# Patient Record
Sex: Female | Born: 1975 | Race: White | Hispanic: No | Marital: Married | Smoking: Never smoker
Health system: Southern US, Community
[De-identification: ages and names within clinical notes are randomized; demographics above are authoritative.]

## PROBLEM LIST (undated history)

## (undated) HISTORY — PX: ABDOMINAL HYSTERECTOMY: SHX81

---

## 2019-09-22 ENCOUNTER — Encounter (HOSPITAL_COMMUNITY): Payer: Self-pay | Admitting: Emergency Medicine

## 2019-09-22 ENCOUNTER — Other Ambulatory Visit: Payer: Self-pay

## 2019-09-22 ENCOUNTER — Emergency Department (HOSPITAL_COMMUNITY): Payer: 59

## 2019-09-22 ENCOUNTER — Emergency Department (HOSPITAL_COMMUNITY)
Admission: EM | Admit: 2019-09-22 | Discharge: 2019-09-22 | Disposition: A | Discharge status: Home / Self Care | Payer: 59 | Attending: Emergency Medicine | Admitting: Emergency Medicine

## 2019-09-22 DIAGNOSIS — R079 Chest pain, unspecified: Secondary | ICD-10-CM | POA: Diagnosis present

## 2019-09-22 DIAGNOSIS — R072 Precordial pain: Secondary | ICD-10-CM | POA: Diagnosis not present

## 2019-09-22 LAB — BASIC METABOLIC PANEL
Anion gap: 10 (ref 5–15)
BUN: 7 mg/dL (ref 6–20)
CO2: 26 mmol/L (ref 22–32)
Calcium: 9.5 mg/dL (ref 8.9–10.3)
Chloride: 100 mmol/L (ref 98–111)
Creatinine, Ser: 0.53 mg/dL (ref 0.44–1.00)
GFR calc Af Amer: >60 mL/min (ref >60)
GFR calc non Af Amer: >60 mL/min (ref >60)
Glucose, Bld: 104 mg/dL — ABNORMAL HIGH (ref 70–99)
Potassium: 4 mmol/L (ref 3.5–5.1)
Sodium: 136 mmol/L (ref 135–145)

## 2019-09-22 LAB — CBC
HCT: 43.9 % (ref 36.0–46.0)
Hemoglobin: 14.3 g/dL (ref 12.0–15.0)
MCH: 29.5 pg (ref 26.0–34.0)
MCHC: 32.6 g/dL (ref 30.0–36.0)
MCV: 90.7 fL (ref 80.0–100.0)
Platelets: 288 10*3/uL (ref 150–400)
RBC: 4.84 MIL/uL (ref 3.87–5.11)
RDW: 12.7 % (ref 11.5–15.5)
WBC: 6.2 10*3/uL (ref 4.0–10.5)
nRBC: 0 % (ref 0.0–0.2)

## 2019-09-22 LAB — TROPONIN I (HIGH SENSITIVITY): Troponin I (High Sensitivity): <2 ng/L (ref <18)

## 2019-09-22 MED ORDER — ALUM & MAG HYDROXIDE-SIMETH 200-200-20 MG/5ML PO SUSP
30.0000 mL | Freq: Once | ORAL | Status: AC
  Administered 2019-09-22: 30 mL via ORAL
  Filled 2019-09-22: qty 30

## 2019-09-22 MED ORDER — LORAZEPAM 2 MG/ML IJ SOLN
0.5000 mg | Freq: Once | INTRAMUSCULAR | Status: AC
  Administered 2019-09-22: 14:00:00 0.5 mg via INTRAVENOUS
  Filled 2019-09-22: qty 1

## 2019-09-22 MED ORDER — PANTOPRAZOLE SODIUM 40 MG PO TBEC: 40.0000 mg | DELAYED_RELEASE_TABLET | Freq: Every day | ORAL | 0 refills | Status: AC

## 2019-09-22 MED ORDER — FAMOTIDINE 20 MG PO TABS
20.0000 mg | ORAL_TABLET | Freq: Once | ORAL | Status: AC
  Administered 2019-09-22: 14:00:00 20 mg via ORAL
  Filled 2019-09-22: qty 1

## 2019-09-22 NOTE — ED Provider Notes (Signed)
[facility] EMERGENCY DEPARTMENT Provider Note   CSN: [id number] Arrival date & time: 09/22/19  1212     History Chief Complaint  Patient presents with  . Chest Pain    Katelyn Combs is a 44 y.o. female.  Patient c/o mid chest pain since approximately 3 am today. Symptoms acute onset at rest, dull, moderate, constant, persistent, non radiating, without specific exacerbating or alleviating factors. Pain in midline, mid to lower sternal area. Not pleuritic. No back or flank pain. No abd pain. Denies hx gerd. No associated nv, diaphoresis or sob. No other recent cp or discomfort even w exertion. No unusual doe or fatigue. No personal or family hx cad. No leg pain or swelling. No hx dvt or pe. No recent surgery or immobility. No chest wall injury or strain. No cough. No fever or chills.   The history is provided by the patient.  Chest Pain Associated symptoms: no abdominal pain, no back pain, no cough, no fever, no headache, no shortness of breath and no vomiting        History reviewed. No pertinent past medical history.  There are no problems to display for this patient.   Past Surgical History:  Procedure Laterality Date  . ABDOMINAL HYSTERECTOMY     partial     OB History   No obstetric history on file.     History reviewed. No pertinent family history.  Social History   Tobacco Use  . Smoking status: Never Smoker  . Smokeless tobacco: Never Used  Substance Use Topics  . Alcohol use: Yes    Comment: occ  . Drug use: Not on file    Home Medications Prior to Admission medications   Not on File    Allergies    Patient has no allergy information on record.  Review of Systems   Review of Systems  Constitutional: Negative for chills and fever.  HENT: Negative for sore throat.   Eyes: Negative for redness.  Respiratory: Negative for cough and shortness of breath.   Cardiovascular: Positive for chest pain.  Gastrointestinal: Negative for abdominal pain  and vomiting.  Genitourinary: Negative for flank pain.  Musculoskeletal: Negative for back pain and neck pain.  Skin: Negative for rash.  Neurological: Negative for headaches.  Hematological: Does not bruise/bleed easily.  Psychiatric/Behavioral: Negative for confusion.    Physical Exam Updated Vital Signs BP 125/82 (BP Location: Right Arm)   Pulse 79   Temp 97.9 F (36.6 C) (Oral)   Resp 20   Ht 1.702 m (5\' 7" )   Wt 65.8 kg   SpO2 100%   BMI 22.71 kg/m   Physical Exam Vitals and nursing note reviewed.  Constitutional:      Appearance: Normal appearance. She is well-developed.  HENT:     Head: Atraumatic.     Nose: Nose normal.     Mouth/Throat:     Mouth: Mucous membranes are moist.  Eyes:     General: No scleral icterus.    Conjunctiva/sclera: Conjunctivae normal.  Neck:     Trachea: No tracheal deviation.  Cardiovascular:     Rate and Rhythm: Normal rate and regular rhythm.     Pulses: Normal pulses.     Heart sounds: Normal heart sounds. No murmur. No friction rub. No gallop.   Pulmonary:     Effort: Pulmonary effort is normal. No respiratory distress.     Breath sounds: Normal breath sounds.  Chest:     Chest wall: Tenderness present.  Abdominal:  General: Bowel sounds are normal. There is no distension.     Palpations: Abdomen is soft. There is no mass.     Tenderness: There is no abdominal tenderness. There is no guarding or rebound.     Hernia: No hernia is present.  Genitourinary:    Comments: No cva tenderness.  Musculoskeletal:        General: No swelling or tenderness.     Cervical back: Normal range of motion and neck supple. No rigidity. No muscular tenderness.     Right lower leg: No edema.     Left lower leg: No edema.  Skin:    General: Skin is warm and dry.     Findings: No rash.  Neurological:     Mental Status: She is alert.     Comments: Alert, speech normal.   Psychiatric:     Comments: Anxious appearing.      ED Results /  Procedures / Treatments   Labs (all labs ordered are listed, but only abnormal results are displayed) Results for orders placed or performed during the hospital encounter of 09/22/19  Basic metabolic panel  Result Value Ref Range   Sodium 136 135 - 145 mmol/L   Potassium 4.0 3.5 - 5.1 mmol/L   Chloride 100 98 - 111 mmol/L   CO2 26 22 - 32 mmol/L   Glucose, Bld 104 (H) 70 - 99 mg/dL   BUN 7 6 - 20 mg/dL   Creatinine, Ser 4.19 0.44 - 1.00 mg/dL   Calcium 9.5 8.9 - 62.2 mg/dL   GFR calc non Af Amer >60 >60 mL/min   GFR calc Af Amer >60 >60 mL/min   Anion gap 10 5 - 15  CBC  Result Value Ref Range   WBC 6.2 4.0 - 10.5 K/uL   RBC 4.84 3.87 - 5.11 MIL/uL   Hemoglobin 14.3 12.0 - 15.0 g/dL   HCT 29.7 98.9 - 21.1 %   MCV 90.7 80.0 - 100.0 fL   MCH 29.5 26.0 - 34.0 pg   MCHC 32.6 30.0 - 36.0 g/dL   RDW 94.1 74.0 - 81.4 %   Platelets 288 150 - 400 K/uL   nRBC 0.0 0.0 - 0.2 %  Troponin I (High Sensitivity)  Result Value Ref Range   Troponin I (High Sensitivity) <2 <18 ng/L   DG Chest 2 View  Result Date: 09/22/2019 CLINICAL DATA:  Chest pain EXAM: CHEST - 2 VIEW COMPARISON:  None. FINDINGS: The heart size and mediastinal contours are within normal limits. Both lungs are clear. No pleural effusion or pneumothorax. The visualized skeletal structures are unremarkable. IMPRESSION: No acute process in the chest. Electronically Signed   By: Guadlupe Spanish M.D.   On: 09/22/2019 14:09    EKG EKG Interpretation  Date/Time:  Monday Sep 22 2019 13:34:59 EDT Ventricular Rate:  62 PR Interval:    QRS Duration: 87 QT Interval:  399 QTC Calculation: 406 R Axis:   107 Text Interpretation: Sinus rhythm Right axis deviation No previous tracing Confirmed by Cathren Laine ([id number]) on 09/22/2019 1:35:55 PM   Radiology DG Chest 2 View  Result Date: 09/22/2019 CLINICAL DATA:  Chest pain EXAM: CHEST - 2 VIEW COMPARISON:  None. FINDINGS: The heart size and mediastinal contours are within normal  limits. Both lungs are clear. No pleural effusion or pneumothorax. The visualized skeletal structures are unremarkable. IMPRESSION: No acute process in the chest. Electronically Signed   By: Guadlupe Spanish M.D.   On: 09/22/2019 14:09  Procedures Procedures (including critical care time)  Medications Ordered in ED Medications - No data to display  ED Course  I have reviewed the triage vital signs and the nursing notes.  Pertinent labs & imaging results that were available during my care of the patient were reviewed by me and considered in my medical decision making (see chart for details).    MDM Rules/Calculators/A&P                     Iv ns. Continuous pulse ox and monitor. Stat labs. Pcxr. Ecg.   Reviewed nursing notes and prior charts for additional history.   pepcid and maalox for symptom relief.   Ativan .5 mg iv for anxiety.   Initial labs reviewed/interpreted by me -  Trop normal - after constant/persistent symptoms for approx 10+ hrs, trop is normal, felt not c/w acs.   Recheck symptoms improved/resolved. Vitals normal.   CXR reviewed/interpreted by me - no pna.   Patient currently appears stable for d/c.   Will try gi meds at home. rec pcp f/u.   Return precautions provided.      Final Clinical Impression(s) / ED Diagnoses Final diagnoses:  None    Rx / DC Orders ED Discharge Orders    None       Cathren Laine, MD 09/22/19 1442

## 2019-09-22 NOTE — Discharge Instructions (Addendum)
It was our pleasure to provide your ER care today - we hope that you feel better.  Take protonix (acid blocker medication). You may also try pepcid, maalox or acetaminophen as need for symptom relief.   Follow up with primary care doctor in 1-2 weeks.   For chest discomfort, you may also follow up with cardiologist in the next couple weeks.  Return to ER if worse, new symptoms, recurrent or persistent chest pain, increased trouble breathing, or other concern.

## 2019-09-22 NOTE — ED Triage Notes (Signed)
Per EMS, pt reports chest pain, headache, nausea since 3 am. Pt reports "knot" in chest with pain onset. 4mg  zofran administered en route. Pt rating pain 7/10. Pt took 3 baby aspirin prior to ems arrival.

## 2019-09-22 NOTE — Progress Notes (Signed)
CSW has reviewed patients chart and notes that patient is without primary care and/or insurance. CSW will follow up with patient regarding this matter and provide assistance as needed.   Recently switched insurance along with moving to a different location. Patient states that although her current pcp is 30 minutes away. Patient states that she wishes for her current to be added to her chart until she finds another.    Patient states her primary care doctor is Clarita Crane with [facility]. CSW will update patients chart to reflect pcp.   Amberli Ruegg Sherryle Lis LCSWA Transitions of Care  Clinical Social Worker  Ph: [phone]

## 2020-05-25 ENCOUNTER — Other Ambulatory Visit: Payer: Self-pay

## 2020-05-25 DIAGNOSIS — Z20822 Contact with and (suspected) exposure to covid-19: Secondary | ICD-10-CM

## 2020-05-25 DIAGNOSIS — J069 Acute upper respiratory infection, unspecified: Secondary | ICD-10-CM | POA: Diagnosis not present

## 2020-05-26 LAB — SARS-COV-2, NAA 2 DAY TAT

## 2020-05-26 LAB — NOVEL CORONAVIRUS, NAA: SARS-CoV-2, NAA: DETECTED — AB

## 2020-05-31 DIAGNOSIS — U071 COVID-19: Secondary | ICD-10-CM | POA: Diagnosis not present

## 2020-05-31 DIAGNOSIS — F411 Generalized anxiety disorder: Secondary | ICD-10-CM | POA: Diagnosis not present

## 2020-05-31 DIAGNOSIS — Z90710 Acquired absence of both cervix and uterus: Secondary | ICD-10-CM | POA: Diagnosis not present

## 2020-12-28 ENCOUNTER — Other Ambulatory Visit: Payer: Self-pay

## 2020-12-28 ENCOUNTER — Ambulatory Visit
Admission: EM | Admit: 2020-12-28 | Discharge: 2020-12-28 | Discharge status: Absent without leave | Payer: BC Managed Care – PPO

## 2021-06-02 DIAGNOSIS — Z90710 Acquired absence of both cervix and uterus: Secondary | ICD-10-CM | POA: Diagnosis not present

## 2021-06-02 DIAGNOSIS — F411 Generalized anxiety disorder: Secondary | ICD-10-CM | POA: Diagnosis not present

## 2021-06-30 DIAGNOSIS — F411 Generalized anxiety disorder: Secondary | ICD-10-CM | POA: Diagnosis not present

## 2021-06-30 DIAGNOSIS — Z1231 Encounter for screening mammogram for malignant neoplasm of breast: Secondary | ICD-10-CM | POA: Diagnosis not present

## 2021-09-12 IMAGING — DX DG CHEST 2V
2 series · 2 of 2 positions shown · non-contrast
Comparison: None.

CLINICAL DATA: Chest pain

EXAM:
CHEST - 2 VIEW

[chest pa]
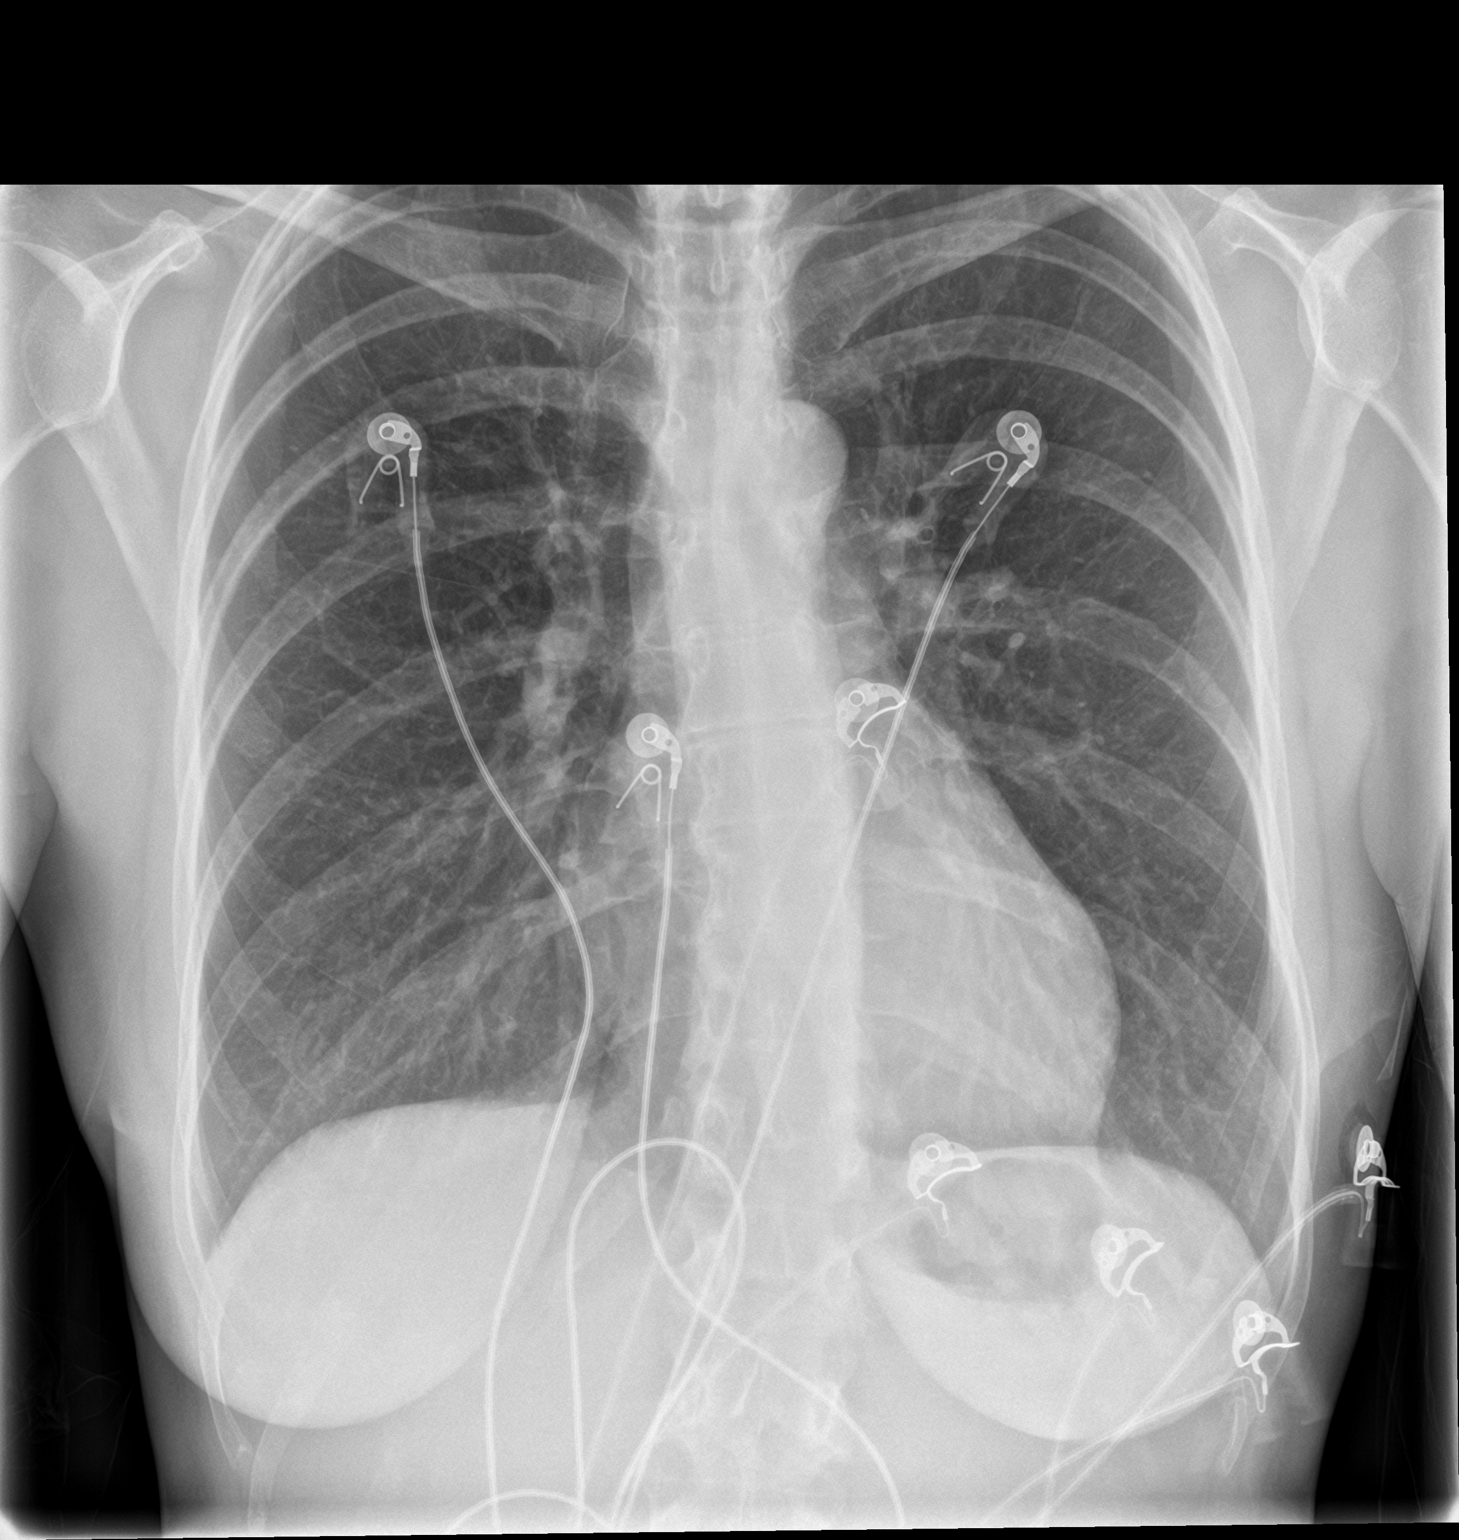

[chest lat]
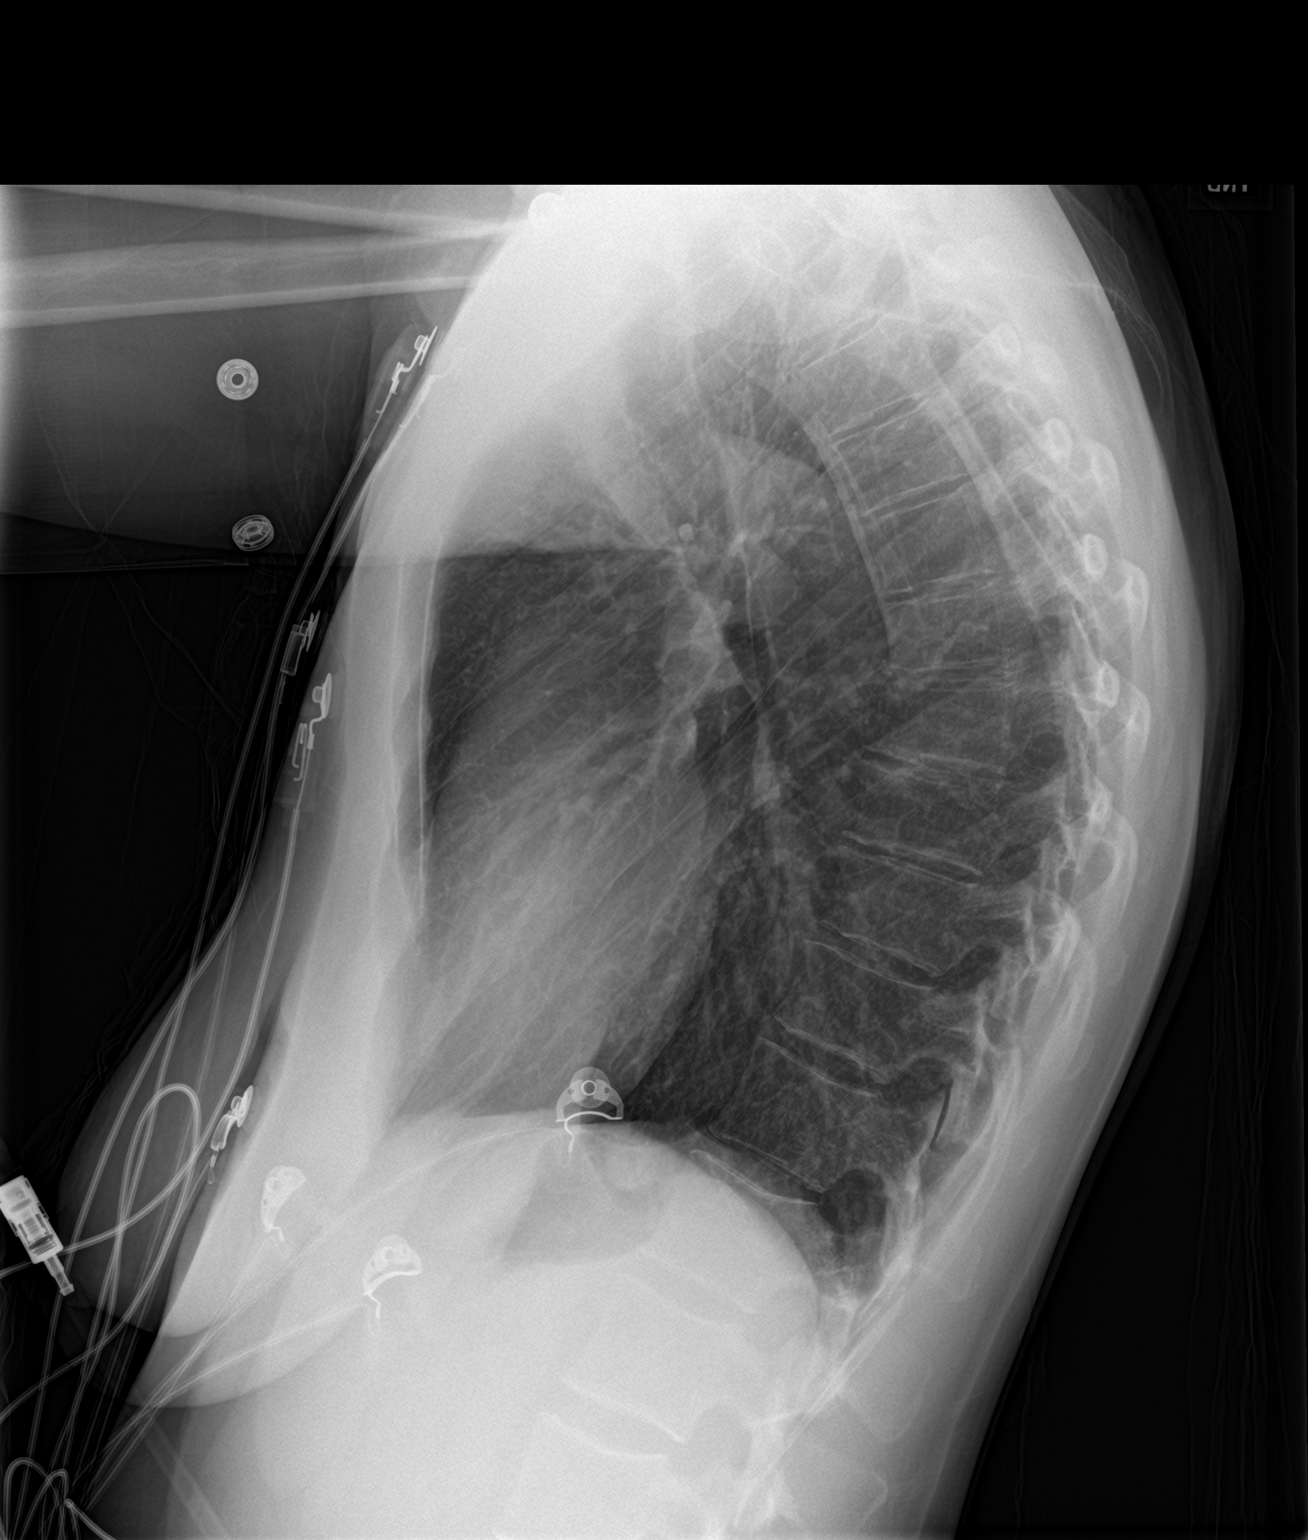

[2 of 2 positions shown; findings below may reference images not displayed]

FINDINGS: The heart size and mediastinal contours are within normal limits.
Both lungs are clear. No pleural effusion or pneumothorax. The
visualized skeletal structures are unremarkable.
IMPRESSION: No acute process in the chest.

## 2021-09-28 ENCOUNTER — Ambulatory Visit
Admission: RE | Admit: 2021-09-28 | Discharge: 2021-09-28 | Disposition: A | Discharge status: Home / Self Care | Payer: BC Managed Care – PPO | Source: Ambulatory Visit

## 2021-09-28 VITALS — BP 157/91 | HR 84 | Temp 97.8°F | Resp 18

## 2021-09-28 DIAGNOSIS — J069 Acute upper respiratory infection, unspecified: Secondary | ICD-10-CM

## 2021-09-28 MED ORDER — CETIRIZINE HCL 10 MG PO TABS: 10.0000 mg | ORAL_TABLET | Freq: Every day | ORAL | 0 refills | Status: AC

## 2021-09-28 MED ORDER — AMOXICILLIN-POT CLAVULANATE 875-125 MG PO TABS: 1.0000 | ORAL_TABLET | Freq: Two times a day (BID) | ORAL | 0 refills | Status: AC

## 2021-09-28 MED ORDER — PSEUDOEPH-BROMPHEN-DM 30-2-10 MG/5ML PO SYRP: 5.0000 mL | ORAL_SOLUTION | Freq: Four times a day (QID) | ORAL | 0 refills | Status: AC | PRN

## 2021-09-28 MED ORDER — FLUTICASONE PROPIONATE 50 MCG/ACT NA SUSP: 2.0000 | Freq: Every day | NASAL | 0 refills | Status: AC

## 2021-09-28 NOTE — ED Provider Notes (Signed)
?[facility] ? ? ? ?CSN: [id number] ?Arrival date & time: 09/28/21  1044 ? ? ?  ? ?History   ?Chief Complaint ?Chief Complaint  ?Patient presents with  ? Cough  ?  Entered by patient  ? Appointment  ?  1100  ? ? ?HPI ?Katelyn Combs is a 46 y.o. female.  ? ?The patient is a 46 year old female who presents with upper respiratory symptoms. The patient reports nasal congestion x 5 days; chest congestion x 2 days. Chest congestion and heaviness is worse when coughing.  The cough is persistent throughout the day, it is nonproductive.  She also complains of "heaviness" in her chest.  She also complains of intermittent chills.  She denies fever, headache, sore throat, abdominal pain, nausea, vomiting, or diarrhea.  She denies any past history of seasonal allergies or other sick contacts. NyQuil gives some relief.  ? ?The history is provided by the patient.  ? ?History reviewed. No pertinent past medical history. ? ?There are no problems to display for this patient. ? ? ?Past Surgical History:  ?Procedure Laterality Date  ? ABDOMINAL HYSTERECTOMY    ? partial  ? ? ?OB History   ?No obstetric history on file. ?  ? ? ? ?Home Medications   ? ?Prior to Admission medications   ?Medication Sig Start Date End Date Taking? Authorizing Provider  ?amoxicillin-clavulanate (AUGMENTIN) 875-125 MG tablet Take 1 tablet by mouth every 12 (twelve) hours. 10/01/21  Yes Darel Ricketts-Warren, Alda Lea, NP  ?brompheniramine-pseudoephedrine-DM 30-2-10 MG/5ML syrup Take 5 mLs by mouth 4 (four) times daily as needed. 09/28/21  Yes Deonna Krummel-Warren, Alda Lea, NP  ?cetirizine (ZYRTEC) 10 MG tablet Take 1 tablet (10 mg total) by mouth daily. 09/28/21  Yes Chayil Gantt-Warren, Alda Lea, NP  ?fluticasone (FLONASE) 50 MCG/ACT nasal spray Place 2 sprays into both nostrils daily. 09/28/21  Yes Zia Kanner-Warren, Alda Lea, NP  ?PARoxetine (PAXIL-CR) 12.5 MG 24 hr tablet Take by mouth. 06/30/21  Yes [provider]  ?Pseudoeph-Doxylamine-DM-APAP (NYQUIL PO)  Take by mouth.   Yes [provider]  ?pantoprazole (PROTONIX) 40 MG tablet Take 1 tablet (40 mg total) by mouth daily. 09/22/19   Lajean Saver, MD  ? ? ?Family History ?History reviewed. No pertinent family history. ? ?Social History ?Social History  ? ?Tobacco Use  ? Smoking status: Never  ? Smokeless tobacco: Never  ?Substance Use Topics  ? Alcohol use: Yes  ?  Comment: occ  ? Drug use: Never  ? ? ? ?Allergies   ?Patient has no known allergies. ? ? ?Review of Systems ?Review of Systems  ?Constitutional:  Positive for chills.  ?HENT:  Positive for congestion and postnasal drip.   ?Respiratory:  Positive for cough. Negative for shortness of breath and wheezing.   ?Cardiovascular: Negative.   ?Gastrointestinal: Negative.   ?Skin: Negative.   ?Psychiatric/Behavioral: Negative.    ? ? ?Physical Exam ?Triage Vital Signs ?ED Triage Vitals  ?Enc Vitals Group  ?   BP 09/28/21 1053 (!) 157/91  ?   Pulse Rate 09/28/21 1053 84  ?   Resp 09/28/21 1053 18  ?   Temp 09/28/21 1053 97.8 ?F (36.6 ?C)  ?   Temp Source 09/28/21 1053 Oral  ?   SpO2 09/28/21 1053 97 %  ?   Weight --   ?   Height --   ?   Head Circumference --   ?   Peak Flow --   ?   Pain Score 09/28/21 1052 0  ?  Pain Loc --   ?   Pain Edu? --   ?   Excl. in [place]? --   ? ?No data found. ? ?Updated Vital Signs ?BP (!) 157/91 (BP Location: Right Arm)   Pulse 84   Temp 97.8 ?F (36.6 ?C) (Oral)   Resp 18   SpO2 97%  ? ?Visual Acuity ?Right Eye Distance:   ?Left Eye Distance:   ?Bilateral Distance:   ? ?Right Eye Near:   ?Left Eye Near:    ?Bilateral Near:    ? ?Physical Exam ?Constitutional:   ?   Appearance: Normal appearance. She is well-developed and normal weight.  ?HENT:  ?   Head: Normocephalic and atraumatic.  ?   Right Ear: Tympanic membrane, ear canal and external ear normal.  ?   Left Ear: Tympanic membrane, ear canal and external ear normal.  ?   Nose: Congestion present.  ?   Right Turbinates: Enlarged and swollen.  ?   Left Turbinates: Enlarged  and swollen.  ?   Right Sinus: Frontal sinus tenderness present. No maxillary sinus tenderness.  ?   Left Sinus: Frontal sinus tenderness present. No maxillary sinus tenderness.  ?   Mouth/Throat:  ?   Mouth: Mucous membranes are moist.  ?Eyes:  ?   Conjunctiva/sclera: Conjunctivae normal.  ?   Pupils: Pupils are equal, round, and reactive to light.  ?Neck:  ?   Thyroid: No thyromegaly.  ?   Trachea: No tracheal deviation.  ?Cardiovascular:  ?   Rate and Rhythm: Normal rate and regular rhythm.  ?   Pulses: Normal pulses.  ?   Heart sounds: Normal heart sounds.  ?Pulmonary:  ?   Effort: Pulmonary effort is normal.  ?   Breath sounds: Normal breath sounds.  ?Abdominal:  ?   General: Bowel sounds are normal. There is no distension.  ?   Palpations: Abdomen is soft.  ?   Tenderness: There is no abdominal tenderness.  ?Musculoskeletal:  ?   Cervical back: Normal range of motion and neck supple.  ?Skin: ?   General: Skin is warm and dry.  ?Neurological:  ?   Mental Status: She is alert and oriented to person, place, and time.  ?Psychiatric:     ?   Behavior: Behavior normal.     ?   Thought Content: Thought content normal.     ?   Judgment: Judgment normal.  ? ? ? ?UC Treatments / Results  ?Labs ?(all labs ordered are listed, but only abnormal results are displayed) ?Labs Reviewed - No data to display ? ?EKG ? ? ?Radiology ?No results found. ? ?Procedures ?Procedures (including critical care time) ? ?Medications Ordered in UC ?Medications - No data to display ? ?Initial Impression / Assessment and Plan / UC Course  ?I have reviewed the triage vital signs and the nursing notes. ? ?Pertinent labs & imaging results that were available during my care of the patient were reviewed by me and considered in my medical decision making (see chart for details). ? ?The patient is a 46 year old female who presents for upper respiratory symptoms.  Symptoms have been present for the past 4 days.  She also complains of a persistent cough,  which is nonproductive.  Her exam is reassuring at this time, no indications of a bacterial infection as she has not had fever, chills, she is even afebrile at this time.  Symptoms are consistent with a viral upper respiratory infection.  We will treat the  patient with symptomatic treatment at this time to include Bromfed, cetirizine, and Flonase.  Will provide the patient with a watch and wait strategy regarding antibiotic therapy.  Patient advised that if her symptoms continue to persist or worsen the next 3 days, Augmentin has been sent to the pharmacy for her to start.  Patient advised to provide supportive care to include increasing fluids and getting plenty of rest.  Ibuprofen or Tylenol for pain or fever.  Follow-up if symptoms do not improve after completing antibiotics if necessary. ?Final Clinical Impressions(s) / UC Diagnoses  ? ?Final diagnoses:  ?Acute upper respiratory infection  ? ? ? ?Discharge Instructions   ? ?  ?Take medication as prescribed.  As discussed, if your symptoms continue to worsen or persist in the next 3 days, pick up the antibiotic from your pharmacy. ?Increase fluids and get plenty of rest. ?Continue ibuprofen or Tylenol for pain, fever, or general discomfort. ?Use a humidifier at bedtime to help with cough and congestion. ?Follow-up if symptoms do not improve. ? ? ? ? ?ED Prescriptions   ? ? Medication Sig Dispense Auth. Provider  ? brompheniramine-pseudoephedrine-DM 30-2-10 MG/5ML syrup Take 5 mLs by mouth 4 (four) times daily as needed. 140 mL Jabier Deese-Warren, Alda Lea, NP  ? fluticasone (FLONASE) 50 MCG/ACT nasal spray Place 2 sprays into both nostrils daily. 16 g Kimika Streater-Warren, Alda Lea, NP  ? cetirizine (ZYRTEC) 10 MG tablet Take 1 tablet (10 mg total) by mouth daily. 30 tablet Ezinne Yogi-Warren, Alda Lea, NP  ? amoxicillin-clavulanate (AUGMENTIN) 875-125 MG tablet Take 1 tablet by mouth every 12 (twelve) hours. 14 tablet Vincenta Steffey-Warren, Alda Lea, NP  ? ?  ? ?PDMP not reviewed  this encounter. ?  ?Tish Men, NP ?09/28/21 1129 ? ?

## 2021-09-28 NOTE — ED Triage Notes (Signed)
Pt reports nasal congestion x 5 days; chest congestion x 2 days. Chest congestion and heaviness is worse when coughing. NyQuil gives some relief. ?

## 2021-09-28 NOTE — Discharge Instructions (Signed)
Take medication as prescribed.  As discussed, if your symptoms continue to worsen or persist in the next 3 days, pick up the antibiotic from your pharmacy. ?Increase fluids and get plenty of rest. ?Continue ibuprofen or Tylenol for pain, fever, or general discomfort. ?Use a humidifier at bedtime to help with cough and congestion. ?Follow-up if symptoms do not improve. ?

## 2021-12-27 DIAGNOSIS — F411 Generalized anxiety disorder: Secondary | ICD-10-CM | POA: Diagnosis not present

## 2021-12-27 DIAGNOSIS — Z7689 Persons encountering health services in other specified circumstances: Secondary | ICD-10-CM | POA: Diagnosis not present

## 2021-12-27 DIAGNOSIS — Z0189 Encounter for other specified special examinations: Secondary | ICD-10-CM | POA: Diagnosis not present

## 2022-01-11 DIAGNOSIS — Z0001 Encounter for general adult medical examination with abnormal findings: Secondary | ICD-10-CM | POA: Diagnosis not present

## 2022-04-14 DIAGNOSIS — J101 Influenza due to other identified influenza virus with other respiratory manifestations: Secondary | ICD-10-CM | POA: Diagnosis not present

## 2022-04-27 DIAGNOSIS — J019 Acute sinusitis, unspecified: Secondary | ICD-10-CM | POA: Diagnosis not present

## 2022-04-27 DIAGNOSIS — R059 Cough, unspecified: Secondary | ICD-10-CM | POA: Diagnosis not present

## 2022-09-10 ENCOUNTER — Other Ambulatory Visit: Payer: Self-pay

## 2022-09-10 ENCOUNTER — Emergency Department (HOSPITAL_COMMUNITY)
Admission: EM | Admit: 2022-09-10 | Discharge: 2022-09-10 | Disposition: A | Discharge status: Home / Self Care | Payer: BC Managed Care – PPO | Attending: Emergency Medicine | Admitting: Emergency Medicine

## 2022-09-10 ENCOUNTER — Encounter (HOSPITAL_COMMUNITY): Payer: Self-pay

## 2022-09-10 ENCOUNTER — Emergency Department (HOSPITAL_COMMUNITY): Payer: BC Managed Care – PPO

## 2022-09-10 DIAGNOSIS — R0789 Other chest pain: Secondary | ICD-10-CM | POA: Insufficient documentation

## 2022-09-10 DIAGNOSIS — R0602 Shortness of breath: Secondary | ICD-10-CM | POA: Insufficient documentation

## 2022-09-10 DIAGNOSIS — R42 Dizziness and giddiness: Secondary | ICD-10-CM | POA: Insufficient documentation

## 2022-09-10 DIAGNOSIS — G4489 Other headache syndrome: Secondary | ICD-10-CM | POA: Diagnosis not present

## 2022-09-10 DIAGNOSIS — R55 Syncope and collapse: Secondary | ICD-10-CM | POA: Insufficient documentation

## 2022-09-10 DIAGNOSIS — R519 Headache, unspecified: Secondary | ICD-10-CM | POA: Diagnosis not present

## 2022-09-10 DIAGNOSIS — R110 Nausea: Secondary | ICD-10-CM | POA: Insufficient documentation

## 2022-09-10 DIAGNOSIS — R6884 Jaw pain: Secondary | ICD-10-CM | POA: Diagnosis not present

## 2022-09-10 DIAGNOSIS — R000 Tachycardia, unspecified: Secondary | ICD-10-CM | POA: Diagnosis not present

## 2022-09-10 LAB — CBC WITH DIFFERENTIAL/PLATELET
Abs Immature Granulocytes: 0.02 10*3/uL (ref 0.00–0.07)
Basophils Absolute: 0 10*3/uL (ref 0.0–0.1)
Basophils Relative: 1 %
Eosinophils Absolute: 0 10*3/uL (ref 0.0–0.5)
Eosinophils Relative: 0 %
HCT: 39.1 % (ref 36.0–46.0)
Hemoglobin: 13.2 g/dL (ref 12.0–15.0)
Immature Granulocytes: 0 %
Lymphocytes Relative: 21 %
Lymphs Abs: 1.3 10*3/uL (ref 0.7–4.0)
MCH: 29.5 pg (ref 26.0–34.0)
MCHC: 33.8 g/dL (ref 30.0–36.0)
MCV: 87.3 fL (ref 80.0–100.0)
Monocytes Absolute: 0.3 10*3/uL (ref 0.1–1.0)
Monocytes Relative: 6 %
Neutro Abs: 4.5 10*3/uL (ref 1.7–7.7)
Neutrophils Relative %: 72 %
Platelets: 252 10*3/uL (ref 150–400)
RBC: 4.48 MIL/uL (ref 3.87–5.11)
RDW: 13 % (ref 11.5–15.5)
WBC: 6.2 10*3/uL (ref 4.0–10.5)
nRBC: 0 % (ref 0.0–0.2)

## 2022-09-10 LAB — COMPREHENSIVE METABOLIC PANEL
ALT: 14 U/L (ref 0–44)
AST: 22 U/L (ref 15–41)
Albumin: 3.8 g/dL (ref 3.5–5.0)
Alkaline Phosphatase: 33 U/L — ABNORMAL LOW (ref 38–126)
Anion gap: 12 (ref 5–15)
BUN: 10 mg/dL (ref 6–20)
CO2: 22 mmol/L (ref 22–32)
Calcium: 9.1 mg/dL (ref 8.9–10.3)
Chloride: 100 mmol/L (ref 98–111)
Creatinine, Ser: 0.64 mg/dL (ref 0.44–1.00)
GFR, Estimated: >60 mL/min (ref >60)
Glucose, Bld: 98 mg/dL (ref 70–99)
Potassium: 3.7 mmol/L (ref 3.5–5.1)
Sodium: 134 mmol/L — ABNORMAL LOW (ref 135–145)
Total Bilirubin: 0.7 mg/dL (ref 0.3–1.2)
Total Protein: 6.6 g/dL (ref 6.5–8.1)

## 2022-09-10 LAB — TROPONIN I (HIGH SENSITIVITY)
Troponin I (High Sensitivity): 2 ng/L (ref <18)
Troponin I (High Sensitivity): 7 ng/L (ref <18)

## 2022-09-10 MED ORDER — MECLIZINE HCL 25 MG PO TABS
12.5000 mg | ORAL_TABLET | Freq: Once | ORAL | Status: AC
  Administered 2022-09-10: 12.5 mg via ORAL
  Filled 2022-09-10: qty 1

## 2022-09-10 MED ORDER — LORAZEPAM 1 MG PO TABS
0.5000 mg | ORAL_TABLET | Freq: Once | ORAL | Status: DC
  Filled 2022-09-10: qty 1

## 2022-09-10 MED ORDER — LACTATED RINGERS IV BOLUS
1000.0000 mL | Freq: Once | INTRAVENOUS | Status: AC
  Administered 2022-09-10: 1000 mL via INTRAVENOUS

## 2022-09-10 MED ORDER — PROCHLORPERAZINE EDISYLATE 10 MG/2ML IJ SOLN
10.0000 mg | INTRAMUSCULAR | Status: AC
  Administered 2022-09-10: 10 mg via INTRAVENOUS
  Filled 2022-09-10: qty 2

## 2022-09-10 MED ORDER — KETOROLAC TROMETHAMINE 15 MG/ML IJ SOLN
15.0000 mg | Freq: Once | INTRAMUSCULAR | Status: AC
  Administered 2022-09-10: 15 mg via INTRAVENOUS
  Filled 2022-09-10: qty 1

## 2022-09-10 MED ORDER — DEXAMETHASONE SODIUM PHOSPHATE 10 MG/ML IJ SOLN
10.0000 mg | Freq: Once | INTRAMUSCULAR | Status: AC
  Administered 2022-09-10: 10 mg via INTRAVENOUS
  Filled 2022-09-10: qty 1

## 2022-09-10 NOTE — ED Notes (Signed)
Pt was able to go to the restroom with little assistance with pts husband

## 2022-09-10 NOTE — ED Notes (Signed)
Discharge instructions discussed with pt. Verbalized understanding. VSS. No questions or concerns regarding discharge  

## 2022-09-10 NOTE — ED Triage Notes (Signed)
At [employer], when had sudden onset of left jaw pain that felt like a knot, then began getting anxious and dizzy, pt proceeded to lay down in floor, denies head injury or LOC, had nausea, received 4mg  Zofran with EMS

## 2022-09-10 NOTE — ED Provider Notes (Signed)
[place] EMERGENCY DEPARTMENT AT [facility] Provider Note   CSN: [id number] Arrival date & time: 09/10/22  1038     History Chief Complaint  Patient presents with   Dizziness   Jaw Pain    Katelyn Combs is a 47 y.o. female with history of generalized anxiety disorder presents the emergency room today for evaluation of left-sided jaw pain.  Patient reports that she felt a knot just under her left jaw and felt her jaw become tight like she "needed to pop it".  She reports that she Millie that she was having hard tach and started to panic.  She felt lightheaded and woozy and felt nauseous and like she was going to pass out.  Denies any room spinning sensation she laid on the ground but did not have a syncopal episode.  She denies any blurry vision.  She reports that she now has a frontal headache and still has some nausea.  No numbness or tingling.  She denies any trouble walking or talking now.  Denies any dental pain.  She reports that she is having some shortness of breath with some chest tightness but denies any chest pain.not or through exertion she thinks is from her anxiety.  No known drug allergies.  Reports occasional alcohol use otherwise denies any tobacco or illicit drug use.   Dizziness Associated symptoms: nausea and shortness of breath   Associated symptoms: no diarrhea and no vomiting        Home Medications Prior to Admission medications   Medication Sig Start Date End Date Taking? Authorizing Provider  amoxicillin-clavulanate (AUGMENTIN) 875-125 MG tablet Take 1 tablet by mouth every 12 (twelve) hours. 10/01/21   Leath-Warren, Sadie Haber, NP  brompheniramine-pseudoephedrine-DM 30-2-10 MG/5ML syrup Take 5 mLs by mouth 4 (four) times daily as needed. 09/28/21   Leath-Warren, Sadie Haber, NP  cetirizine (ZYRTEC) 10 MG tablet Take 1 tablet (10 mg total) by mouth daily. 09/28/21   Leath-Warren, Sadie Haber, NP  fluticasone (FLONASE) 50 MCG/ACT nasal spray Place 2  sprays into both nostrils daily. 09/28/21   Leath-Warren, Sadie Haber, NP  pantoprazole (PROTONIX) 40 MG tablet Take 1 tablet (40 mg total) by mouth daily. 09/22/19   Cathren Laine, MD  PARoxetine (PAXIL-CR) 12.5 MG 24 hr tablet Take by mouth. 06/30/21   [provider]  Pseudoeph-Doxylamine-DM-APAP (NYQUIL PO) Take by mouth.    [provider]      Allergies    Patient has no known allergies.    Review of Systems   Review of Systems  Constitutional:  Negative for chills and fever.  Eyes:  Negative for photophobia and visual disturbance.  Respiratory:  Positive for chest tightness and shortness of breath.   Gastrointestinal:  Positive for nausea. Negative for abdominal pain, diarrhea and vomiting.  Genitourinary:  Negative for dysuria and hematuria.  Musculoskeletal:  Negative for neck pain.  Neurological:  Positive for light-headedness. Negative for dizziness.    Physical Exam Updated Vital Signs SpO2 99%  Physical Exam Vitals and nursing note reviewed.  Constitutional:      Appearance: She is not toxic-appearing.     Comments: Mildly anxious appearing but nontoxic-appearing.  HENT:     Head: Normocephalic and atraumatic.     Mouth/Throat:     Mouth: Mucous membranes are moist.  Eyes:     Extraocular Movements: Extraocular movements intact.     Pupils: Pupils are equal, round, and reactive to light.  Neck:     Thyroid: No thyroid  mass or thyromegaly.     Vascular: Normal carotid pulses. No carotid bruit.     Trachea: Trachea normal.     Comments: Tenderness to the lower part of the mandibular angle on the left and tenderness into the left jaw and the face.  No obvious swelling, crepitus, induration, or fluctuance seen.  Dentition appears intact as well. Cardiovascular:     Rate and Rhythm: Normal rate.     Pulses: Normal pulses.  Pulmonary:     Effort: Pulmonary effort is normal. No respiratory distress.     Breath sounds: Normal breath sounds.  Abdominal:      General: Abdomen is flat.     Tenderness: There is no abdominal tenderness. There is no guarding or rebound.  Musculoskeletal:     Cervical back: Normal range of motion. No rigidity or crepitus.     Right lower leg: No edema.     Left lower leg: No edema.  Lymphadenopathy:     Cervical: No cervical adenopathy.  Skin:    General: Skin is warm and dry.  Neurological:     General: No focal deficit present.     Mental Status: She is alert.     GCS: GCS eye subscore is 4. GCS verbal subscore is 5. GCS motor subscore is 6.     Cranial Nerves: No cranial nerve deficit, dysarthria or facial asymmetry.     Sensory: No sensory deficit.     Motor: No weakness or pronator drift.     Comments: GCS 15.  Cranial nerves II through XII intact.  No facial symmetry noted.  Patient is answering questions appropriately with appropriate speech.  Sensation reportedly intact throughout.  Strength is equal in patient's upper and lower bilateral extremities.  No pronator drift noted.    ED Results / Procedures / Treatments   Labs (all labs ordered are listed, but only abnormal results are displayed) Labs Reviewed  COMPREHENSIVE METABOLIC PANEL - Abnormal; Notable for the following components:      Result Value   Sodium 134 (*)    Alkaline Phosphatase 33 (*)    All other components within normal limits  CBC WITH DIFFERENTIAL/PLATELET  TROPONIN I (HIGH SENSITIVITY)  TROPONIN I (HIGH SENSITIVITY)    EKG EKG Interpretation  Date/Time:  Sunday September 10 2022 11:00:49 EDT Ventricular Rate:  71 PR Interval:  145 QRS Duration: 86 QT Interval:  385 QTC Calculation: 419 R Axis:   83 Text Interpretation: Sinus rhythm Probable left atrial enlargement RSR' in V1 or V2, right VCD or RVH Normal ECG Confirmed by Eber Hong ([id number]) on 09/10/2022 11:12:53 AM  Radiology DG Chest 2 View  Result Date: 09/10/2022 CLINICAL DATA:  Shortness of breath, chest tightness, near syncope EXAM: CHEST - 2 VIEW  COMPARISON:  09/22/2019 FINDINGS: Mildly enlarged cardiac contour, which is new from the prior exam. Normal mediastinal contours. No focal pulmonary opacity. No pleural effusion or pneumothorax. No acute osseous abnormality. IMPRESSION: Mildly enlarged cardiac contour, which is new from the prior exam. This could be due to cardiomegaly or pericardial effusion. Electronically Signed   By: Wiliam Ke M.D.   On: 09/10/2022 11:47    Procedures Procedures   Medications Ordered in ED Medications  lactated ringers bolus 1,000 mL (0 mLs Intravenous Stopped 09/10/22 1253)  meclizine (ANTIVERT) tablet 12.5 mg (12.5 mg Oral Given 09/10/22 1122)  ketorolac (TORADOL) 15 MG/ML injection 15 mg (15 mg Intravenous Given 09/10/22 1309)  dexamethasone (DECADRON) injection 10 mg (10 mg Intravenous  Given 09/10/22 1310)  prochlorperazine (COMPAZINE) injection 10 mg (10 mg Intravenous Given 09/10/22 1311)    ED Course/ Medical Decision Making/ A&P                           Medical Decision Making Amount and/or Complexity of Data Reviewed Labs: ordered. Radiology: ordered.  Risk Prescription drug management.   47 y.o. female presents to the ER for evaluation of jaw pain with near syncope. Differential diagnosis includes but is not limited to TMJ, dental pain, BPPV, vestibular migraine, head trauma, AVM, intracranial tumor, multiple sclerosis, drug-related, CVA, vasovagal syncope, orthostatic hypotension, sepsis, hypoglycemia, electrolyte disturbance, anemia, anxiety/panic attack. Vital signs unremarkable. Physical exam as noted above.   Neuroexam unremarkable.  Regular rate and rhythm.  I do not appreciate any abnormalities into the neck or jaw.  Attending assessed at bedside and agrees.  No bruits auscultated.  Symptoms not worsening with turning her head.  I doubt that she is having any dissection or aneurysm to the area.  There is no obvious swelling, fluctuance, induration, crepitus.  Will proceed with IV  fluids and labs. Migraine cocktail given as well as patient has a frontal headache.  I independently reviewed and interpreted the patient's labs.  CBC without leukocytosis or anemia.  CMP shows mildly decreased at 134, mildly decreased alk phos at 33, otherwise no electrolyte or LFT normality.  Initial troponin is 2 with repeat at 7.  Patient is PERC negative and low Wells criteria.  Doubt any PE.  EKG reviewed and interpreted by attending and read as Sinus rhythm Probable left atrial enlargement RSR' in V1 or V2, right VCD or RVH Normal ECG.  CXR shows mildly enlarged cardiac contour, which is new from the prior exam. This could be due to cardiomegaly or pericardial effusion  After consideration of the diagnostic results and the patients response to treatment, I feel that the patient can be discharged home with PCP follow-up.  Her chest x-ray does show slight abnormality however do not think this is the cause of her symptoms today.  I do not think she needs any CT imaging of her head as it resolved with the migraine cocktail.  Her orthostatics were negative.  She had been having some TMJ pain and then had a panic attack.  EMS reports that she was having a panic attack on their arrival.  She reports that she feels better.  I do not appreciate any thyroid abnormalities.  There is no bruits auscultated.  Likely could be some TMJ.  Vital signs remained stable.  On reevaluation, patient reports that she feels much better after the medication and milligram as a headache.  She reports that she is feeling better and reports that her anxiety has improved as well.  She reports she is feels okay to go home.  She is no longer feeling nausea and she declines any nausea medication to go home with.  We discussed the results of the labs/imaging. The plan is follow-up with primary care, stay well-hydrated. We discussed strict return precautions and red flag symptoms. The patient verbalized their understanding and agrees  to the plan. The patient is stable and being discharged home in good condition.  I discussed this case with my attending physician who cosigned this note including patient's presenting symptoms, physical exam, and planned diagnostics and interventions. Attending physician stated agreement with plan or made changes to plan which were implemented.   Attending physician assessed patient at  bedside.  Portions of this report may have been transcribed using voice recognition software. Every effort was made to ensure accuracy; however, inadvertent computerized transcription errors may be present.   Final Clinical Impression(s) / ED Diagnoses Final diagnoses:  Jaw pain  Near syncope    Rx / DC Orders ED Discharge Orders     None         Achille Rich, Cordelia Poche 09/10/22 1617    Eber Hong, MD 09/12/22 [phone]

## 2022-09-10 NOTE — Discharge Instructions (Signed)
You were seen in the ER today for evaluation after your near syncopal event and jaw pain.  I do think you are experiencing jaw pain from TMJ.  I included an additional information into the discharge paperwork about this.  I would like you to follow-up with your primary care doctor about an abnormal chest x-ray finding.  This could be an over read however I still would like for you to follow-up with a repeat chest x-ray.  Please make sure you call your primary care doctor to arrange this.  In the meantime, please make sure you are eating regularly and drinking plenty of fluids, any water.  If you have any concerns, new or worsening symptoms, please return to the nearest emergency room for evaluation.  Contact a doctor if: You continue to have episodes of near fainting. Get help right away if: You pass out or faint. You have any of these symptoms: Fast or uneven heartbeats (palpitations). Pain in your chest, belly, or back. Shortness of breath. You have a seizure. You have a very bad headache. You are confused. You have trouble seeing. You are very weak. You have trouble walking. You are bleeding from your mouth or butt. You have black or tarry poop (stool). These symptoms may be an emergency. Get help right away. Call your local emergency services (911 in the [place] Do not wait to see if the symptoms will go away. Do not drive yourself to the hospital.

## 2023-02-13 DIAGNOSIS — E782 Mixed hyperlipidemia: Secondary | ICD-10-CM | POA: Diagnosis not present

## 2023-02-20 DIAGNOSIS — Z0000 Encounter for general adult medical examination without abnormal findings: Secondary | ICD-10-CM | POA: Diagnosis not present

## 2023-02-20 DIAGNOSIS — E782 Mixed hyperlipidemia: Secondary | ICD-10-CM | POA: Diagnosis not present

## 2023-02-20 DIAGNOSIS — F411 Generalized anxiety disorder: Secondary | ICD-10-CM | POA: Diagnosis not present

## 2023-02-20 DIAGNOSIS — R748 Abnormal levels of other serum enzymes: Secondary | ICD-10-CM | POA: Diagnosis not present

## 2023-02-20 DIAGNOSIS — Z0001 Encounter for general adult medical examination with abnormal findings: Secondary | ICD-10-CM | POA: Diagnosis not present
